# Patient Record
Sex: Male | Born: 1970 | Hispanic: Yes | Smoking: Current some day smoker
Health system: Southern US, Community
[De-identification: ages and names within clinical notes are randomized; demographics above are authoritative.]

---

## 2017-02-09 ENCOUNTER — Emergency Department
Admission: EM | Admit: 2017-02-09 | Discharge: 2017-02-09 | Disposition: A | Discharge status: Home / Self Care | Payer: Self-pay | Attending: Emergency Medicine | Admitting: Emergency Medicine

## 2017-02-09 ENCOUNTER — Emergency Department: Payer: Self-pay

## 2017-02-09 ENCOUNTER — Encounter: Payer: Self-pay | Admitting: Emergency Medicine

## 2017-02-09 DIAGNOSIS — L03115 Cellulitis of right lower limb: Secondary | ICD-10-CM | POA: Insufficient documentation

## 2017-02-09 DIAGNOSIS — F17210 Nicotine dependence, cigarettes, uncomplicated: Secondary | ICD-10-CM | POA: Insufficient documentation

## 2017-02-09 DIAGNOSIS — L0390 Cellulitis, unspecified: Secondary | ICD-10-CM

## 2017-02-09 MED ORDER — SULFAMETHOXAZOLE-TRIMETHOPRIM 800-160 MG PO TABS: 1.0000 | ORAL_TABLET | Freq: Two times a day (BID) | ORAL | 0 refills | Status: AC

## 2017-02-09 MED ORDER — SULFAMETHOXAZOLE-TRIMETHOPRIM 800-160 MG PO TABS
1.0000 | ORAL_TABLET | Freq: Once | ORAL | Status: AC
  Administered 2017-02-09: 1 via ORAL
  Filled 2017-02-09: qty 1

## 2017-02-09 NOTE — ED Notes (Signed)
Skin marked on RT inner thigh at this time per MD

## 2017-02-09 NOTE — ED Triage Notes (Signed)
[facility] with c/o right thigh pain that started Friday suddenly. Pt has been taking tylenol w/out relief.

## 2017-02-09 NOTE — ED Provider Notes (Signed)
[facility] Emergency Department Provider Note   ____________________________________________   I have reviewed the triage vital signs and the nursing notes.   HISTORY  Chief Complaint Leg Pain   History limited by: Not Limited   HPI Edwin Scott is a 46 y.o. male who presents to the emergency department today because of concern for leg pain. It is located over his right inner thigh. It started 3 days ago. It happened suddenly as he was sitting. It has been persistent since then. He did notice associated redness and yesterday some swelling. The swelling has improved but the redness has persisted. He denies any trauma. Denies similar pain in the past. No fevers. No chest pain or shortness of breath.   History reviewed. No pertinent past medical history.  There are no active problems to display for this patient.   History reviewed. No pertinent surgical history.  Prior to Admission medications   Not on File    Allergies Patient has no known allergies.  No family history on file.  Social History Social History  Substance Use Topics  . Smoking status: Current Some Day Smoker    Types: Cigarettes  . Smokeless tobacco: Never Used  . Alcohol use Yes    Review of Systems Constitutional: No fever/chills Eyes: No visual changes. ENT: No sore throat. Cardiovascular: Denies chest pain. Respiratory: Denies shortness of breath. Gastrointestinal: No abdominal pain.  No nausea, no vomiting.  No diarrhea.   Genitourinary: Negative for dysuria. Musculoskeletal: Positive for right thigh pain.  Skin: redness over right thigh.  Neurological: Negative for headaches, focal weakness or numbness.  ____________________________________________   PHYSICAL EXAM:  VITAL SIGNS: ED Triage Vitals  Enc Vitals Group     BP 02/09/17 1556 (!) 146/103     Pulse Rate 02/09/17 1556 (!) 111     Resp 02/09/17 1556 20     Temp 02/09/17 1556 98.3 F (36.8 C)     Temp Source 02/09/17 1556 Oral     SpO2 02/09/17 1556 100 %     Weight 02/09/17 1551 194 lb (88 kg)     Height 02/09/17 1551 5' 6.93" (1.7 m)     Head Circumference --      Peak Flow --      Pain Score 02/09/17 1551 7     Pain Loc --      Pain Edu? --      Excl. in GC? --      Constitutional: Alert and oriented. Well appearing and in no distress. Eyes: Conjunctivae are normal.  ENT   Head: Normocephalic and atraumatic.   Nose: No congestion/rhinnorhea.   Mouth/Throat: Mucous membranes are moist.   Neck: No stridor. Hematological/Lymphatic/Immunilogical: No cervical lymphadenopathy. Cardiovascular: Normal rate, regular rhythm.  No murmurs, rubs, or gallops.  Respiratory: Normal respiratory effort without tachypnea nor retractions. Breath sounds are clear and equal bilaterally. No wheezes/rales/rhonchi. Gastrointestinal: Soft and non tender. No rebound. No guarding.  Genitourinary: Deferred Musculoskeletal: Normal range of motion in all extremities. No lower extremity edema. Neurologic:  Normal speech and language. No gross focal neurologic deficits are appreciated.  Skin:  Erythema noted over most of right inner thigh. No skin breakdown or ulcers. No fluctuance underlying erythema.  Psychiatric: Mood and affect are normal. Speech and behavior are normal. Patient exhibits appropriate insight and judgment.  ____________________________________________    LABS (pertinent positives/negatives)  None  ____________________________________________   EKG  None  ____________________________________________    RADIOLOGY  R LE US IMPRESSION:  No evidence of DVT within the right lower extremity.  Right femur IMPRESSION:  Negative.    ____________________________________________   PROCEDURES  Procedures  ____________________________________________   INITIAL IMPRESSION / ASSESSMENT AND PLAN / ED COURSE  Pertinent labs & imaging results that were  available during my care of the patient were reviewed by me and considered in my medical decision making (see chart for details).  Patient presented to the emergency department today because of concerns for right thigh pain. X-ray and ultrasound did not show any obvious etiology of the patient's symptoms however physical exam does show some erythema. This is consistent with cellulitis. Will plan on placing patient on antibiotics. Did have nurse outline it. Discussed return precautions.  ____________________________________________   FINAL CLINICAL IMPRESSION(S) / ED DIAGNOSES  Final diagnoses:  Cellulitis, unspecified cellulitis site     Note: This dictation was prepared with Dragon dictation. Any transcriptional errors that result from this process are unintentional     Phineas Semen, MD 02/09/17 1952

## 2017-02-09 NOTE — Discharge Instructions (Signed)
Please seek medical attention for any high fevers, chest pain, shortness of breath, change in behavior, persistent vomiting, bloody stool or any other new or concerning symptoms.  

## 2017-02-09 NOTE — ED Notes (Signed)
Pt c/o RT upper leg pain x3days, pt states noticed swelling and redness to area. Pt c/o pain with any movement, denies any SOB.

## 2018-07-10 IMAGING — CR DG FEMUR 2+V*R*
1 series · 4 of 4 positions shown · non-contrast
Comparison: None.

CLINICAL DATA: Right upper leg pain for 3 days.

EXAM:
RIGHT FEMUR 2 VIEWS

[Series 1: t femur proximal ap right · 0.14mm/px · 4 of 4 slices shown]
[im 1/4]
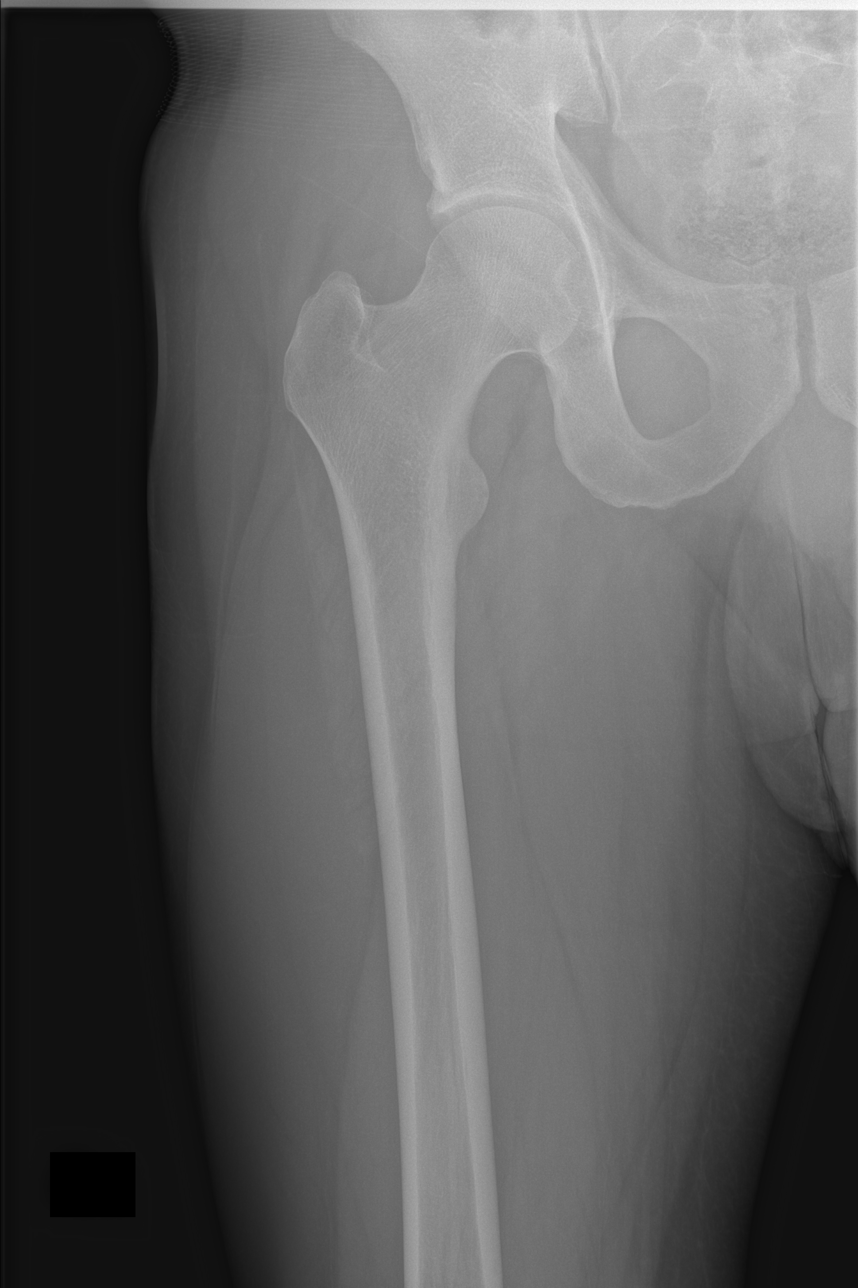
[im 2/4]
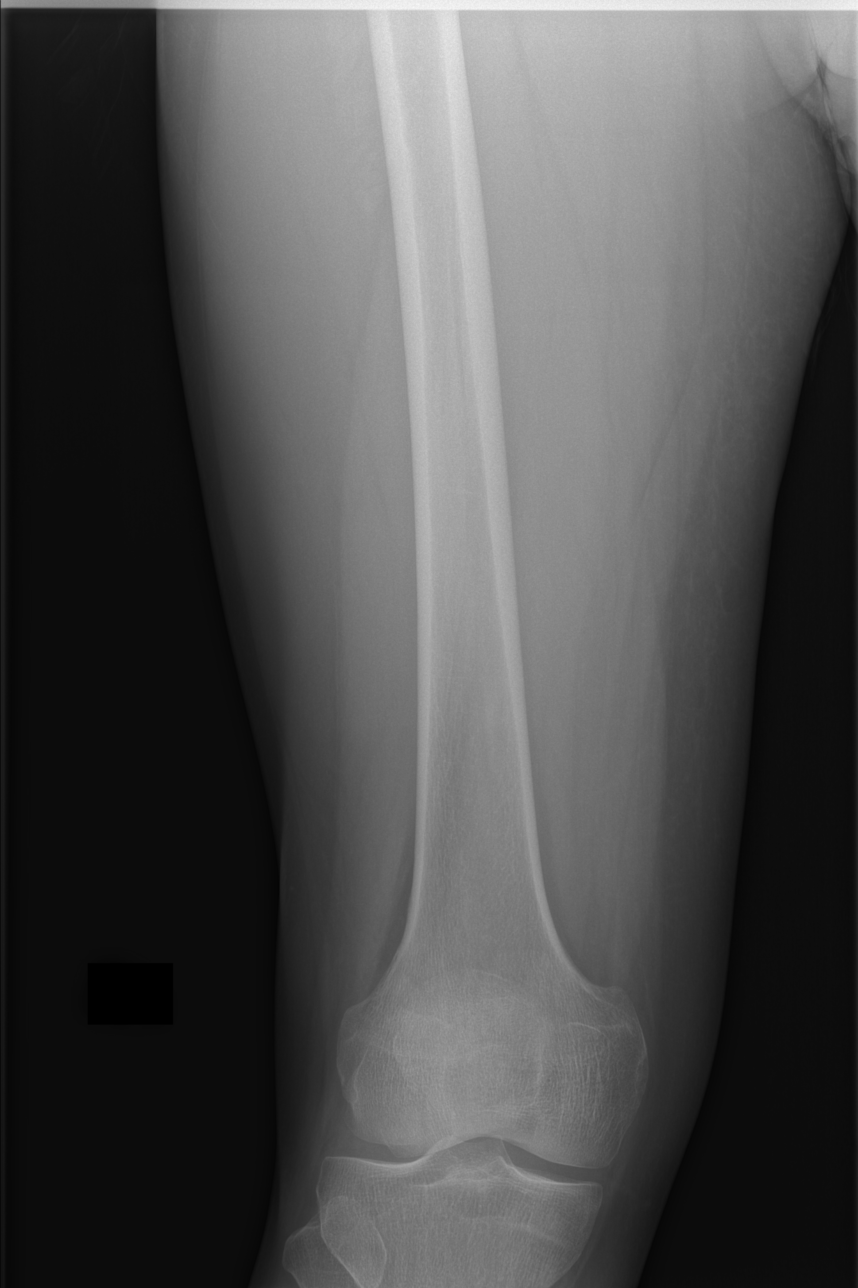
[im 3/4]
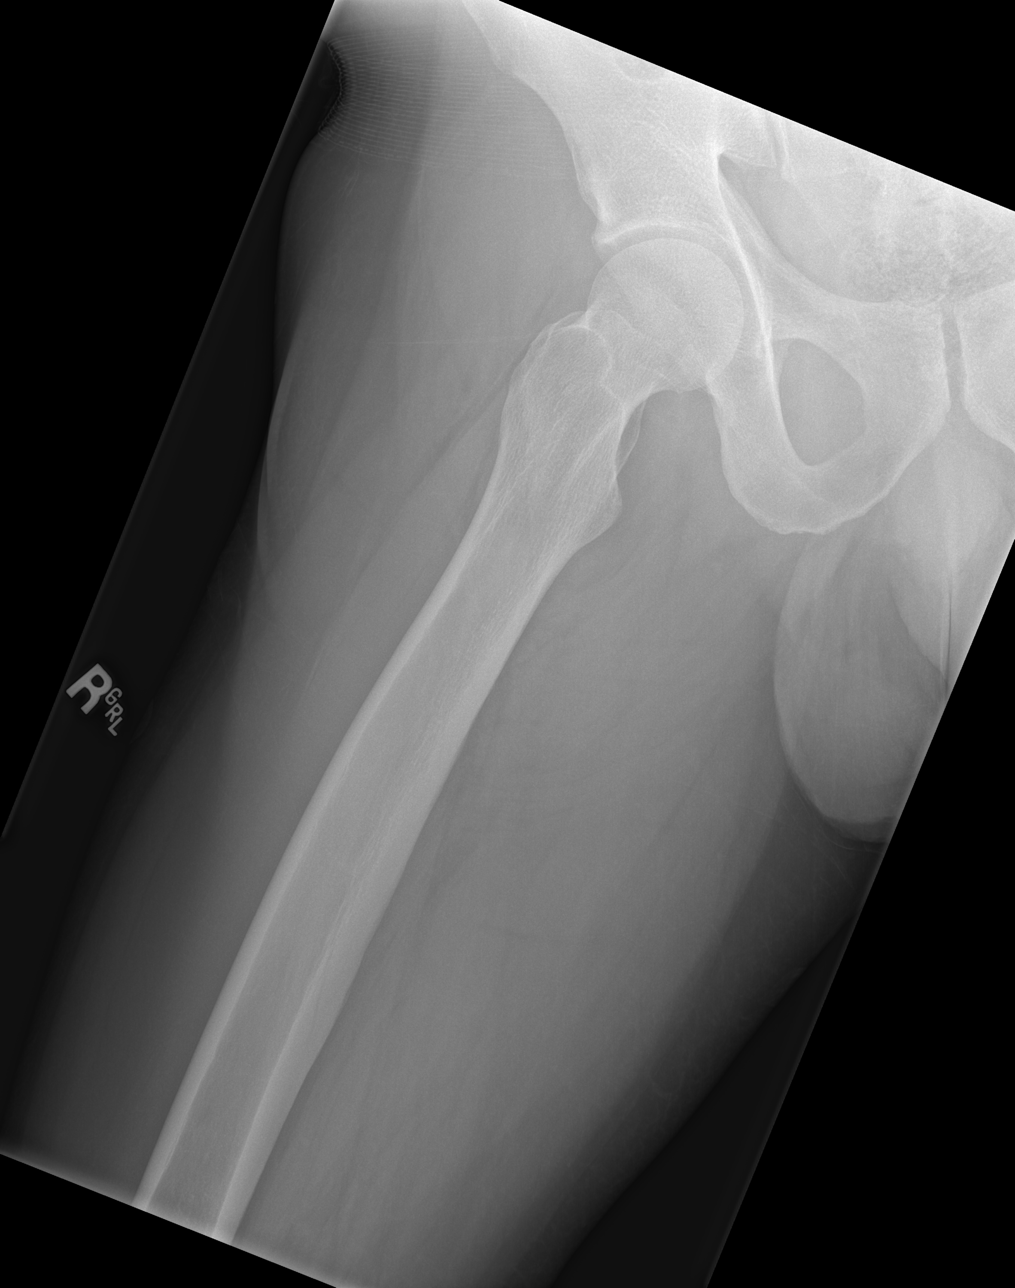
[im 4/4]
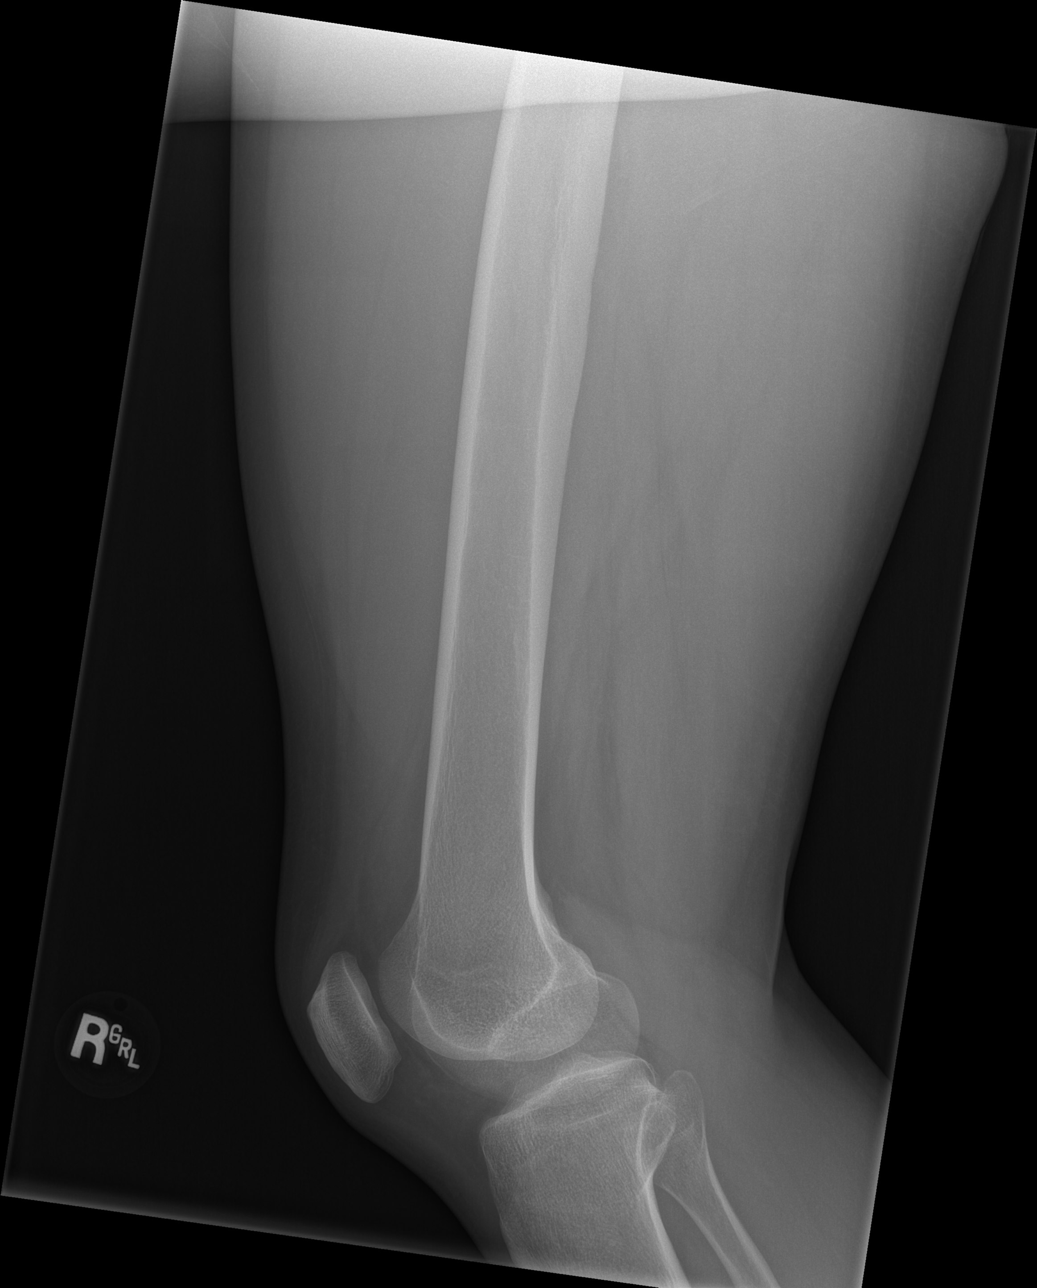

[4 of 4 positions shown; findings below may reference images not displayed]

FINDINGS: There is no evidence of fracture or other focal bone lesions. Soft
tissues are unremarkable.
IMPRESSION: Negative.

## 2019-06-01 IMAGING — US US EXTREM LOW VENOUS*R*
2 series · 13 of 24 positions shown · non-contrast
Comparison: None.

CLINICAL DATA: Right leg pain and swelling



[Series 1: us extrem low venous*right* · 0.07mm/px · 10 of 33 slices shown (1 of 2)]
[im 1/33]
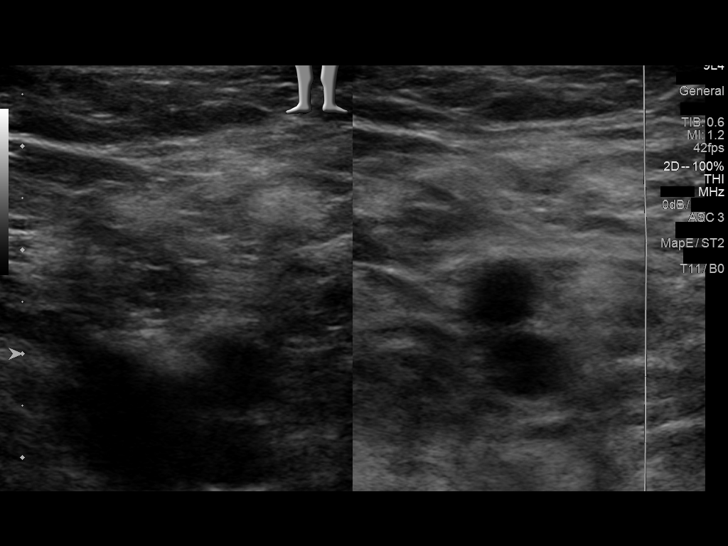
[im 4/33]
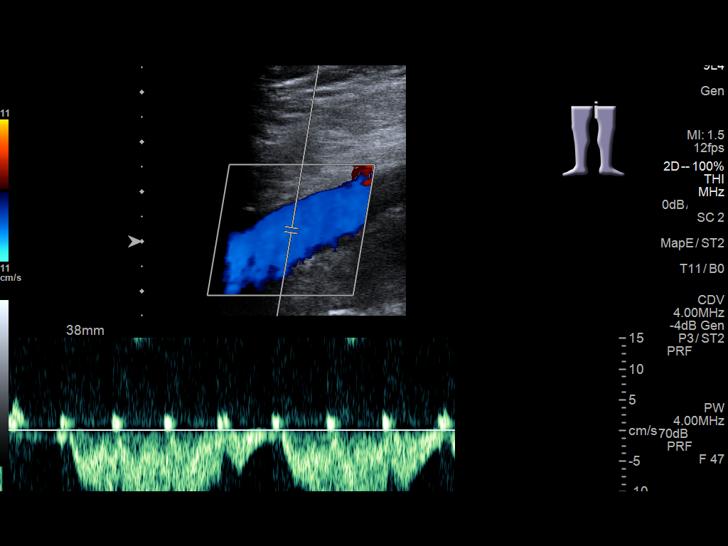
[im 8/33]
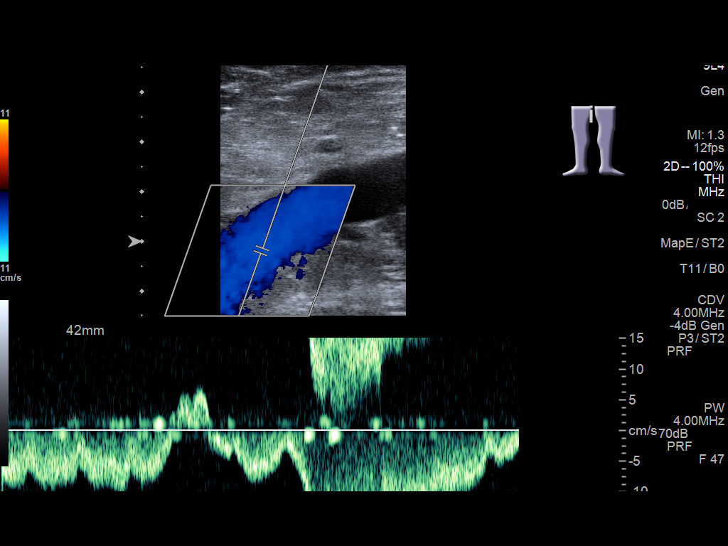
[im 11/33]
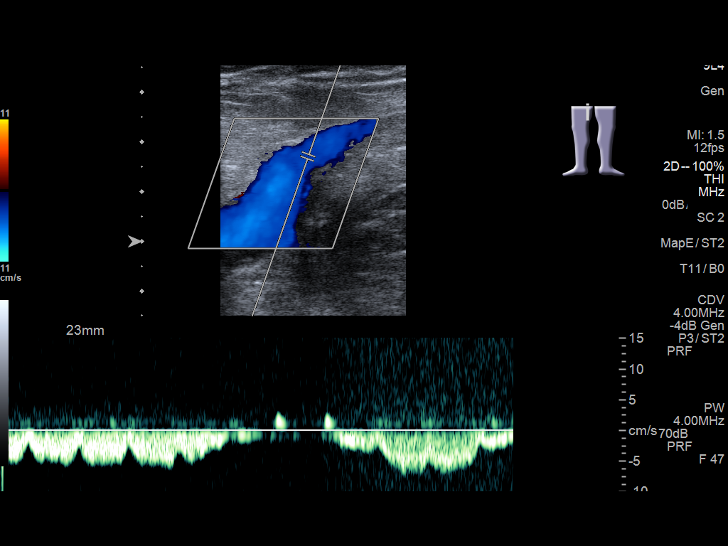
[im 15/33]
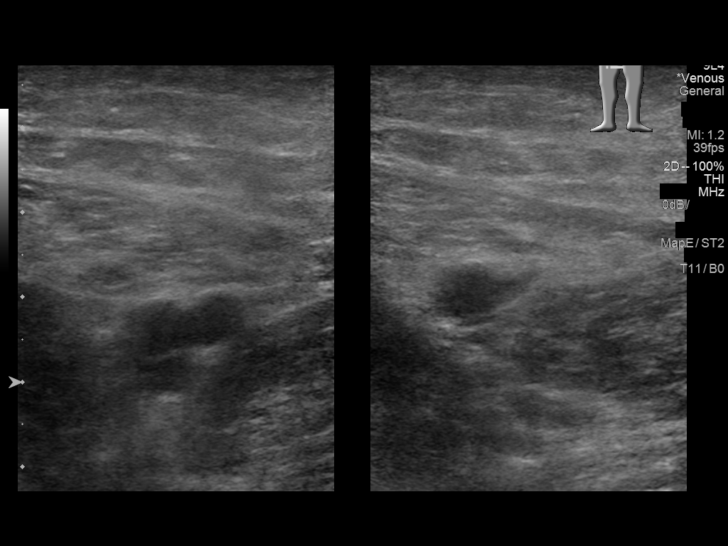
[im 18/33]
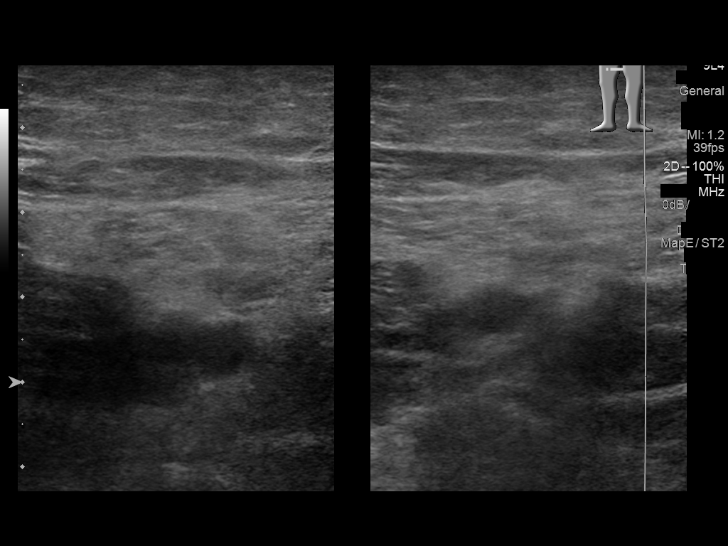
[im 22/33]
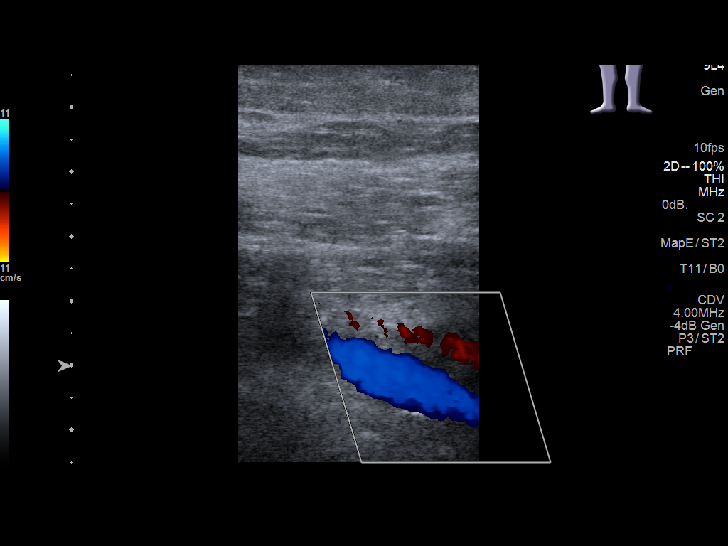
[im 24/33]
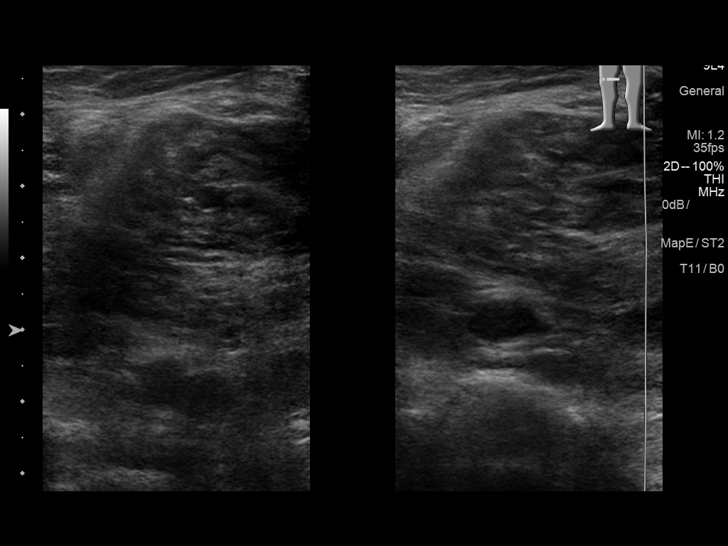
[im 27/33]
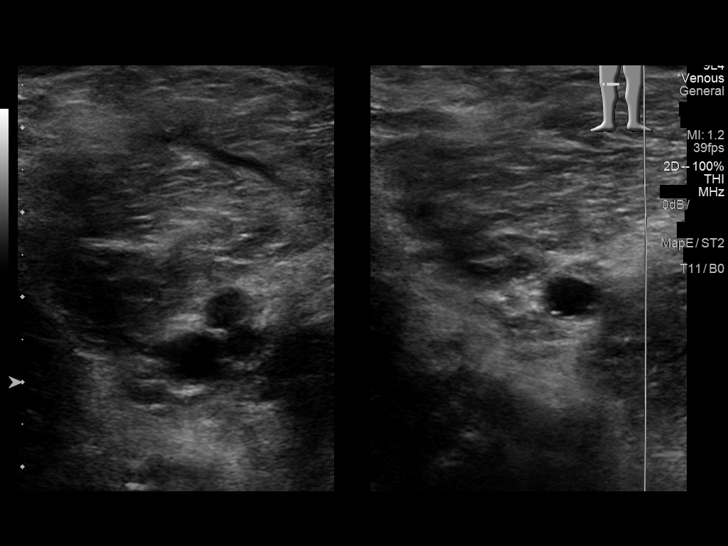
[im 31/33]
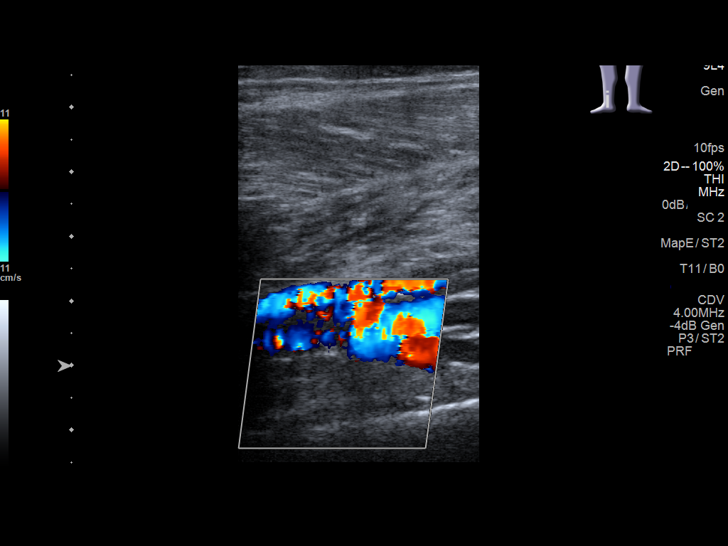

[Series 2: us extrem low venous*right* · 0.08mm/px · 3 of 8 slices shown (2 of 2)]
[im 1/8]
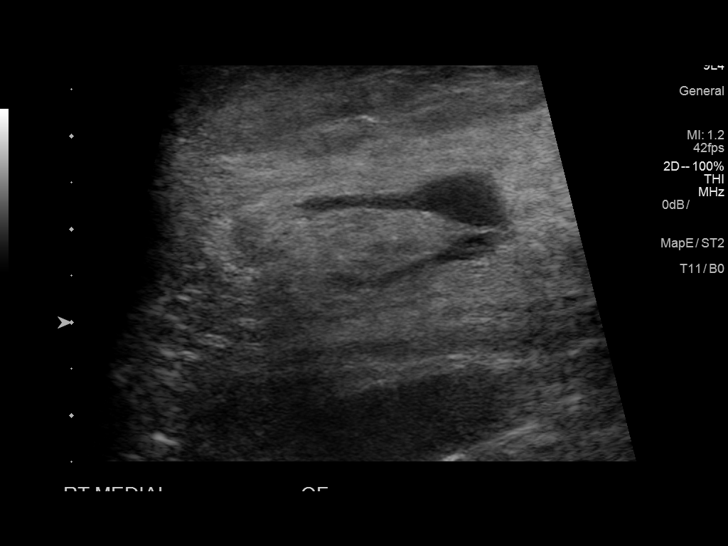
[im 4/8]
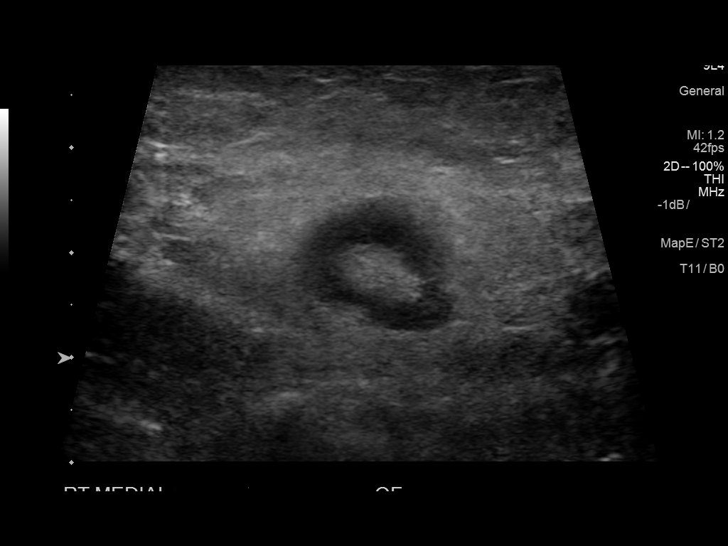
[im 8/8]
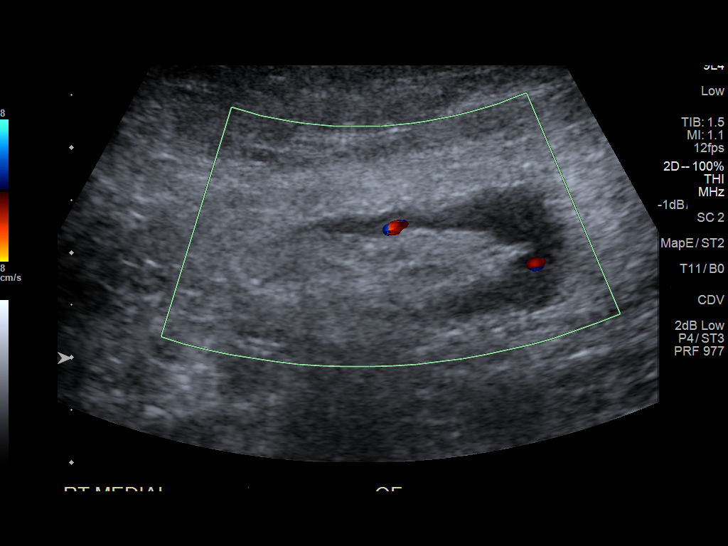

[13 of 24 positions shown; findings below may reference images not displayed]

FINDINGS: Contralateral Common Femoral Vein: Respiratory phasicity is normal
and symmetric with the symptomatic side. No evidence of thrombus.
Normal compressibility.

Common Femoral Vein: No evidence of thrombus. Normal
compressibility, respiratory phasicity and response to augmentation.

Saphenofemoral Junction: No evidence of thrombus. Normal
compressibility and flow on color Doppler imaging.

Profunda Femoral Vein: No evidence of thrombus. Normal
compressibility and flow on color Doppler imaging.

Femoral Vein: No evidence of thrombus. Normal compressibility,
respiratory phasicity and response to augmentation.

Popliteal Vein: No evidence of thrombus. Normal compressibility,
respiratory phasicity and response to augmentation.

Calf Veins: No evidence of thrombus. Normal compressibility and flow
on color Doppler imaging.

Superficial Great Saphenous Vein: No evidence of thrombus. Normal
compressibility and flow on color Doppler imaging.

Venous Reflux:  None.

Other Findings: Prominent, non pathologically enlarged, inguinal
lymph node measuring 0.8 cm in short axis.
IMPRESSION: No evidence of DVT within the right lower extremity.

## 2019-11-18 ENCOUNTER — Ambulatory Visit: Payer: Self-pay | Attending: Internal Medicine

## 2019-11-18 DIAGNOSIS — Z23 Encounter for immunization: Secondary | ICD-10-CM

## 2019-11-18 NOTE — Progress Notes (Signed)
   Covid-19 Vaccination Clinic  Name:  Nyshawn Gowdy    MRN: [id number] DOB: [birth year]  11/18/2019  Mr. Ember Gottwald was observed post Covid-19 immunization for 15 minutes without incident. He was provided with Vaccine Information Sheet and instruction to access the V-Safe system.   Mr. Lily Kernen was instructed to call 911 with any severe reactions post vaccine: Marland Kitchen Difficulty breathing  . Swelling of face and throat  . A fast heartbeat  . A bad rash all over body  . Dizziness and weakness   Immunizations Administered    Name Date Dose VIS Date Route   Pfizer COVID-19 Vaccine 11/18/2019 12:56 PM 0.3 mL 08/12/2019 Intramuscular   Manufacturer: [employer], [employer]   Lot: [id number]   NDC: [address]-7

## 2019-12-09 ENCOUNTER — Ambulatory Visit: Payer: Self-pay | Attending: Internal Medicine

## 2019-12-09 DIAGNOSIS — Z23 Encounter for immunization: Secondary | ICD-10-CM

## 2019-12-09 NOTE — Progress Notes (Signed)
   Covid-19 Vaccination Clinic  Name:  Lily Kernen    MRN: [id number] DOB: 03/06/1971  12/09/2019  Mr. Rohail Klees was observed post Covid-19 immunization for 15 minutes without incident. He was provided with Vaccine Information Sheet and instruction to access the V-Safe system.   Mr. Daison Braxton was instructed to call 911 with any severe reactions post vaccine: Marland Kitchen Difficulty breathing  . Swelling of face and throat  . A fast heartbeat  . A bad rash all over body  . Dizziness and weakness   Immunizations Administered    Name Date Dose VIS Date Route   Pfizer COVID-19 Vaccine 12/09/2019 12:33 PM 0.3 mL 08/12/2019 Intramuscular   Manufacturer: [employer], [employer]   Lot: [id number]   NDC: [address]-1

## 2021-10-28 ENCOUNTER — Other Ambulatory Visit: Payer: Self-pay

## 2021-10-28 ENCOUNTER — Emergency Department
Admission: EM | Admit: 2021-10-28 | Discharge: 2021-10-28 | Disposition: A | Discharge status: Home / Self Care | Payer: Self-pay | Attending: Emergency Medicine | Admitting: Emergency Medicine

## 2021-10-28 DIAGNOSIS — H9202 Otalgia, left ear: Secondary | ICD-10-CM

## 2021-10-28 DIAGNOSIS — H6092 Unspecified otitis externa, left ear: Secondary | ICD-10-CM | POA: Insufficient documentation

## 2021-10-28 DIAGNOSIS — H60332 Swimmer's ear, left ear: Secondary | ICD-10-CM | POA: Insufficient documentation

## 2021-10-28 MED ORDER — CIPRO HC 0.2-1 % OT SUSP: 3.0000 [drp] | Freq: Two times a day (BID) | OTIC | 0 refills | Status: AC

## 2021-10-28 MED ORDER — KETOROLAC TROMETHAMINE 30 MG/ML IJ SOLN
30.0000 mg | Freq: Once | INTRAMUSCULAR | Status: AC
  Administered 2021-10-28: 30 mg via INTRAMUSCULAR
  Filled 2021-10-28: qty 1

## 2021-10-28 NOTE — Discharge Instructions (Signed)
Please take Tylenol and ibuprofen/Advil for your pain.  It is safe to take them together, or to alternate them every few hours.  Take up to 1000mg  of Tylenol at a time, up to 4 times per day.  Do not take more than 4000 mg of Tylenol in 24 hours.  For ibuprofen, take 400-600 mg, 4-5 times per day.  Continue to take the amoxicillin antibiotics already prescribed, and finish that course.  Please also add Cipro drops to the left ear.  This prescription is waiting for you at the [facility] pharmacy.  3 drops in the left ear per dose, 2 times per day for the next week.  Please reach out to the local ENT clinic to be seen to make sure you are improving. If you develop any fevers or severe pain despite the above regimen, then please return to the ED.

## 2021-10-28 NOTE — ED Provider Notes (Signed)
[facility] Provider Note    Event Date/Time   First MD Initiated Contact with Patient 10/28/21 [phone]     (approximate)   History   Otalgia   HPI  Edwin Scott is a 51 y.o. male who presents to the ED for evaluation of Otalgia   Patient presents to the ED for evaluation of left ear pain. No significant history in the chart.  Denies any history of diabetes.  Patient reports a shower 5 days ago where he got water in both of his ears and could not get it out.  Reports feeling like the water was stuck in his ears. He went to an urgent care yesterday and was prescribed amoxicillin twice daily for AOM.  He shows me this prescription bottle.  He reports improvement of his right ear, but reports persistent pain to his left ear so he presents for evaluation of poor pain management of his left ear and difficulty sleeping.  Reports improved pain with Tylenol, but has not used any NSAIDs.  Denies fevers or systemic symptoms.  Denies headache, vision changes, difficulty chewing or swallowing, hearing changes, tinnitus.   Physical Exam   Triage Vital Signs: ED Triage Vitals  Enc Vitals Group     BP 10/28/21 0153 (!) 144/92     Pulse Rate 10/28/21 0153 82     Resp 10/28/21 0153 18     Temp 10/28/21 0153 98.7 F (37.1 C)     Temp Source 10/28/21 0153 Oral     SpO2 10/28/21 0153 96 %     Weight 10/28/21 0150 200 lb (90.7 kg)     Height 10/28/21 0150 5\' 6"  (1.676 m)     Head Circumference --      Peak Flow --      Pain Score 10/28/21 0150 7     Pain Loc --      Pain Edu? --      Excl. in GC? --     Most recent vital signs: Vitals:   10/28/21 0153  BP: (!) 144/92  Pulse: 82  Resp: 18  Temp: 98.7 F (37.1 C)  SpO2: 96%    General: Awake, no distress.  Ambulatory with a normal gait.  Obese.  Pleasant and conversational. CV:  Good peripheral perfusion.  Resp:  Normal effort.  Abd:  No distention.  MSK:  No deformity noted.  Neuro:  No focal  deficits appreciated. Cranial nerves II through XII intact 5/5 strength and sensation in all 4 extremities  Other:  No tenderness to the mastoid process bilaterally.  No external erythema, swelling or skin changes around the ear or to the pinna itself. Right TM with small purulence, but no bulging, erythema or ruptured TM.  No pain with pinna manipulation or otoscope insertion. Left TM with erythema and cloudiness.  No ruptured TM.  Does not have pain with pinna manipulation, but does have pain with otoscope insertion and this external auditory canal is more swollen diffusely compared to the right   ED Results / Procedures / Treatments   Labs (all labs ordered are listed, but only abnormal results are displayed) Labs Reviewed - No data to display  EKG   RADIOLOGY   Official radiology report(s): No results found.  PROCEDURES and INTERVENTIONS:  Procedures  Medications  ketorolac (TORADOL) 30 MG/ML injection 30 mg (has no administration in time range)     IMPRESSION / MDM / ASSESSMENT AND PLAN / ED COURSE  I reviewed the  triage vital signs and the nursing notes.  51 year old male presents to the ED with evidence of otitis externa on the left suitable for outpatient management with ENT follow-up.  He looks systemically well.  Ambulatory with a normal gait.  No evidence of trauma, neurologic or vascular deficits.  No mastoid tenderness or signs of mastoiditis.  No evidence of malignant otitis externa.  His left ear has evidence of otitis externa, superimposed with possible AOM.  His AOM is being treated appropriately, but I think he would benefit from otic drops for his swimmers ear.  No indications for further diagnostics or CNS imaging.  We will provide Toradol prior to discharge to help with his pain, and discussed multimodal pain management at home.  We discussed adherence to his amoxicillin and following up with ENT.  We discussed return precautions.      FINAL CLINICAL  IMPRESSION(S) / ED DIAGNOSES   Final diagnoses:  Acute swimmer's ear of left side  Otalgia of left ear     Rx / DC Orders   ED Discharge Orders          Ordered    ciprofloxacin-hydrocortisone (CIPRO HC) OTIC suspension  2 times daily        10/28/21 0209             Note:  This document was prepared using Dragon voice recognition software and may include unintentional dictation errors.   Delton Prairie, MD 10/28/21 [phone]

## 2021-10-28 NOTE — ED Triage Notes (Signed)
Patient reports left ear pain for 2 days.
